# Patient Record
Sex: Female | Born: 1985 | Race: White | Hispanic: No | Marital: Married | Smoking: Never smoker
Health system: Southern US, Academic
[De-identification: ages and names within clinical notes are randomized; demographics above are authoritative.]

## PROBLEM LIST (undated history)

## (undated) DIAGNOSIS — F32A Depression, unspecified: Secondary | ICD-10-CM

## (undated) DIAGNOSIS — F419 Anxiety disorder, unspecified: Secondary | ICD-10-CM

## (undated) DIAGNOSIS — M549 Dorsalgia, unspecified: Secondary | ICD-10-CM

## (undated) HISTORY — PX: TUBAL LIGATION: SHX77

## (undated) HISTORY — PX: BREAST ENHANCEMENT SURGERY: SHX7

## (undated) HISTORY — PX: HX APPENDECTOMY: SHX54

---

## 2007-11-14 ENCOUNTER — Encounter (EMERGENCY_DEPARTMENT_HOSPITAL): Payer: Self-pay

## 2007-11-14 ENCOUNTER — Emergency Department
Admission: EM | Admit: 2007-11-14 | Discharge: 2007-11-14 | Disposition: A | Discharge status: Home / Self Care | Payer: Self-pay | Source: Emergency Department | Attending: Emergency Medicine-WVUH | Admitting: Emergency Medicine-WVUH

## 2007-11-14 ENCOUNTER — Encounter (HOSPITAL_COMMUNITY): Payer: Self-pay

## 2007-11-14 DIAGNOSIS — N390 Urinary tract infection, site not specified: Secondary | ICD-10-CM | POA: Insufficient documentation

## 2007-11-14 LAB — URINALYSIS MACROSCOPIC WITH REFLEX TO MICROSCOPIC URINALYSIS (CULTURE NOT PERFORMED)
BILIRUBIN: NEGATIVE
GLUCOSE: NEGATIVE mg/dL
PH URINE: 5.5 (ref 5.0–8.0)
SPECIFIC GRAVITY, URINE: 1.007 (ref 1.005–1.030)

## 2007-11-14 LAB — URINALYSIS, MICROSCOPIC: WBC'S: 143 /HPF — ABNORMAL HIGH (ref 0–6)

## 2007-11-14 MED ORDER — PHENAZOPYRIDINE 200 MG TABLET
200.00 mg | ORAL_TABLET | Freq: Three times a day (TID) | ORAL | Status: DC
Start: 2007-11-14 — End: 2007-12-23

## 2007-11-14 MED ORDER — NITROFURANTOIN MONOHYDRATE/MACROCRYSTALS 100 MG CAPSULE
100.00 mg | ORAL_CAPSULE | Freq: Two times a day (BID) | ORAL | Status: DC
Start: 2007-11-14 — End: 2007-12-23

## 2007-11-14 NOTE — ED Provider Notes (Signed)
Urinary Pain   The history is provided by the patient. This is a new problem. The current episode started 2 days ago. The problem has been occurring every urination. The problem has been gradually worsening since onset. The quality of the pain is burning. The pain is moderate. There has been no fever. There is no history of pyelonephritis. Associated symptoms include frequency and urgency. Pertinent negatives include no chills, no nausea, no vomiting, no discharge and no flank pain. She has tried nothing for the symptoms.           Review of Systems   Constitutional: Negative for fever and chills.   Gastrointestinal: Negative for nausea, vomiting and abdominal pain.   Genitourinary: Positive for dysuria, urgency and frequency. Negative for flank pain.   Musculoskeletal: Negative for back pain.           History: No past medical history on file.  Past Surgical History   Procedure Date   . Hx appendectomy        History   Social History   . Marital Status: Married     Spouse Name: N/A     Number of Children: N/A   . Years of Education: N/A   Occupational History   . Not on file.   Social History Main Topics   . Tobacco Use: Never   . Alcohol Use: No   . Drug Use: No   . Sexually Active: Not on file   Other Topics Concern   . Not on file   Social History Narrative   . No narrative on file             Physical Exam   Nursing note and vitals reviewed.  Constitutional: She is oriented. She appears well-developed and well-nourished. No distress.   Abdominal: She exhibits no distension. Soft. No tenderness. She has no guarding and no CVA tenderness.   Neurological: She is alert and oriented.   Skin: Skin is warm and dry.   Psychiatric: She has a normal mood and affect. Her behavior is normal. Judgment and thought content normal.           ED Course:    Results:     ED Course:    Evaluation Plan: UTI  Macrobid 100mg  1 po bid x 7d  Pyridium 200mg  #6  F/u as needed.

## 2007-11-14 NOTE — ED Nurses Note (Signed)
Dc instructions reviewed patient home without further questions.  To follow up as needed.

## 2007-11-14 NOTE — ED Nurses Note (Signed)
DC instructions reviewed patient home without further questions

## 2007-11-14 NOTE — ED Nurses Note (Signed)
 U/A collected and sent.

## 2007-11-14 NOTE — ED Nurses Note (Signed)
Patient c/o dysuria and frequency- denies any flank pain, N/V/D.  Patient taken to restroom to obtain a urine specimen.

## 2007-11-14 NOTE — Care Management Notes (Signed)
 Pt requesting assistance with prescriptions for peridium and macrobid . Financial Assessment completed. Arrangements made for pt to have scripts filled at [facility] with bill to Care Management.

## 2007-11-15 LAB — URINE CULTURE,ROUTINE

## 2007-11-25 ENCOUNTER — Ambulatory Visit (INDEPENDENT_AMBULATORY_CARE_PROVIDER_SITE_OTHER): Payer: Self-pay

## 2007-12-23 ENCOUNTER — Encounter (HOSPITAL_COMMUNITY): Payer: Self-pay

## 2007-12-23 ENCOUNTER — Emergency Department
Admission: EM | Admit: 2007-12-23 | Discharge: 2007-12-23 | Discharge status: Against Medical Advice | Payer: 59 | Source: Emergency Department | Attending: Emergency Medicine | Admitting: Emergency Medicine

## 2007-12-23 DIAGNOSIS — Z5320 Procedure and treatment not carried out because of patient's decision for unspecified reasons: Secondary | ICD-10-CM | POA: Insufficient documentation

## 2007-12-23 DIAGNOSIS — N23 Unspecified renal colic: Secondary | ICD-10-CM | POA: Insufficient documentation

## 2007-12-24 ENCOUNTER — Emergency Department
Admission: EM | Admit: 2007-12-24 | Discharge: 2007-12-24 | Disposition: A | Discharge status: Home / Self Care | Payer: 59 | Source: Emergency Department | Attending: Emergency Medicine-WVUH | Admitting: Emergency Medicine-WVUH

## 2007-12-24 ENCOUNTER — Encounter (EMERGENCY_DEPARTMENT_HOSPITAL): Payer: 59

## 2007-12-24 ENCOUNTER — Encounter (HOSPITAL_COMMUNITY): Payer: Self-pay

## 2007-12-24 DIAGNOSIS — N390 Urinary tract infection, site not specified: Secondary | ICD-10-CM | POA: Insufficient documentation

## 2007-12-24 LAB — URINALYSIS, MACROSCOPIC
BILIRUBIN: NEGATIVE
GLUCOSE: NEGATIVE mg/dL
KETONES: 40 mg/dL — AB
NITRITE: NEGATIVE
PH URINE: 6 (ref 5.0–8.0)
SPECIFIC GRAVITY, URINE: 1.018 (ref 1.005–1.030)

## 2007-12-24 LAB — URINALYSIS, MICROSCOPIC: WBC'S: 26 /HPF — ABNORMAL HIGH (ref 0–6)

## 2007-12-24 NOTE — ED Nurses Note (Signed)
Discharged with Rx's and instructions from Leonor Liv, PA and Dr. Dewaine Conger.  She verbalized understanding with instructions.

## 2007-12-24 NOTE — Care Management Notes (Signed)
 Contacted by pt requesting assistance paying for prescriptions that were written in the ED today. Pt has scripts for cipro and pyridium . Care Managements has assisted pt with medications in the past, but did not exceed limit for help. Arrangments made for Care Management to cover the cost of these meds at [facility] ($6.87).

## 2007-12-24 NOTE — ED Nurses Note (Signed)
Secondary history obtained from patient.  She states that she was seen by Dr. Wyn Quaker, for a UTI about four weeks ago, and is not getting any better.  States she has been controlling her symptoms with tylenol.  States she was on macrobid and pyridium.  States that antibiotic did not help.  She is obtaining a urine specimen.

## 2007-12-24 NOTE — ED Provider Notes (Signed)
Urinary Pain   The history is provided by the patient. This is a recurrent problem. The current episode started more than 1 week ago. The problem has been occurring every urination. The problem has been gradually worsening since onset. The quality of the pain is burning. The pain is at a severity of 3/10. There has been no fever. She is sexually active. There is no history of pyelonephritis. Associated symptoms include chills, nausea, frequency and urgency. Pertinent negatives include no vomiting, no discharge, no hematuria, no hesitancy, no possible pregnancy and no flank pain. She has tried nothing for the symptoms. Her past medical history is significant for recurrent UTIs.     Pt presents c/o UTI x 1 week. States that she was here one month ago with similar sxs, was treated with Macrobid and Pyridium and symptoms seemed to get better and go away until last week. Symptoms include dysuria, frequency, urgency. No vaginal discharge. No N/V. No fevers or chills. No back pain.      Review of Systems   Constitutional: Positive for chills. Negative for fever.   Skin: Negative.    HENT: Negative.    Cardiovascular: Negative.    Respiratory: Negative.    Gastrointestinal: Positive for nausea. Negative for vomiting and abdominal pain.   Genitourinary: Positive for dysuria, urgency and frequency. Negative for hesitancy, hematuria and flank pain.           History: No past medical history on file.    Past Surgical History   Procedure Date   . Hx appendectomy          No family history on file.    History   Social History   . Marital Status: Married     Spouse Name: N/A     Number of Children: N/A   . Years of Education: N/A   Occupational History   . Not on file.   Social History Main Topics   . Tobacco Use: Never   . Alcohol Use: No   . Drug Use: No   . Sexually Active: Not on file   Other Topics Concern   . Not on file   Social History Narrative   . No narrative on file               Physical Exam    Constitutional: She is oriented. She appears well-developed and well-nourished. She appears not diaphoretic. No distress.   HENT:   Head: Normocephalic and atraumatic.   Right Ear: External ear normal.   Left Ear: External ear normal.   Eyes: Conjunctivae and extraocular motions are normal. Pupils are equal, round, and reactive to light.   Neck: Normal range of motion. Neck supple.   Cardiovascular: Normal rate, regular rhythm and normal heart sounds.    Pulmonary/Chest: Effort normal and breath sounds normal.   Abdominal: She has no CVA tenderness.   Neurological: She is alert and oriented. No cranial nerve deficit.   Skin: Skin is warm and dry. She is not diaphoretic.   Psychiatric: She has a normal mood and affect. Her behavior is normal. Judgment and thought content normal.           ED Course:    Results:     ED Course: Pt evaluated in fast track    UA, chlamydia/gonorrhea by urine PCR obtained    Evaluation Plan:  Cipro 500mg  1 PO BID x 10d  Pyridium 200mg  TID x 2d    F/U PCP PRN

## 2013-06-26 ENCOUNTER — Ambulatory Visit (HOSPITAL_BASED_OUTPATIENT_CLINIC_OR_DEPARTMENT_OTHER): Payer: Self-pay | Admitting: Advanced Practice Midwife

## 2013-07-07 ENCOUNTER — Other Ambulatory Visit (HOSPITAL_BASED_OUTPATIENT_CLINIC_OR_DEPARTMENT_OTHER): Payer: Self-pay | Admitting: Advanced Practice Midwife

## 2013-07-07 ENCOUNTER — Ambulatory Visit
Admission: RE | Admit: 2013-07-07 | Discharge: 2013-07-07 | Disposition: A | Discharge status: Home / Self Care | Payer: 59 | Source: Ambulatory Visit | Attending: OBSTETRICS/GYNECOLOGY | Admitting: OBSTETRICS/GYNECOLOGY

## 2013-07-07 ENCOUNTER — Encounter (HOSPITAL_BASED_OUTPATIENT_CLINIC_OR_DEPARTMENT_OTHER): Payer: Self-pay | Admitting: Advanced Practice Midwife

## 2013-07-07 ENCOUNTER — Ambulatory Visit (HOSPITAL_BASED_OUTPATIENT_CLINIC_OR_DEPARTMENT_OTHER): Payer: 59 | Admitting: Advanced Practice Midwife

## 2013-07-07 VITALS — BP 98/66 | Ht 63.0 in | Wt 166.9 lb

## 2013-07-07 DIAGNOSIS — N926 Irregular menstruation, unspecified: Secondary | ICD-10-CM

## 2013-07-07 DIAGNOSIS — Z3480 Encounter for supervision of other normal pregnancy, unspecified trimester: Secondary | ICD-10-CM

## 2013-07-07 DIAGNOSIS — Z124 Encounter for screening for malignant neoplasm of cervix: Secondary | ICD-10-CM | POA: Insufficient documentation

## 2013-07-07 DIAGNOSIS — Z3400 Encounter for supervision of normal first pregnancy, unspecified trimester: Secondary | ICD-10-CM | POA: Insufficient documentation

## 2013-07-07 DIAGNOSIS — Z9089 Acquired absence of other organs: Secondary | ICD-10-CM | POA: Insufficient documentation

## 2013-07-07 DIAGNOSIS — Z1151 Encounter for screening for human papillomavirus (HPV): Secondary | ICD-10-CM | POA: Insufficient documentation

## 2013-07-07 NOTE — Progress Notes (Addendum)
Patient here with husband Nate for New OB visit.  Planned pregnancy dated by sure LMP.  G1 P0  Feeling well.  Questions regarding timing of u/s.  Planning homebirth, doesn't have provider chosen yet.  Establishing care for hospital OB care as needed.    NOB History taken as charted below:  No past medical history on file.   Past Surgical History   Procedure Laterality Date   . Hx appendectomy        Family History   Problem Relation Age of Onset   . Breast Cancer Neg Hx    . Ovarian Cancer Neg Hx    . Uterine Cancer Neg Hx    . Colon Cancer Neg Hx    . Pancreatic Cancer Neg Hx    . Lung Cancer Maternal Grandfather    . Heart Disease Maternal Uncle    . Heart Disease       cousin   . Diabetes Father         Other than ROS in the HPI, all other systems were negative.     Prenatal physical done as charted below:  OB Prenatal Exam: Filed by Tona Sensingerbone, Juandaniel Manfredo, CNM on 07/07/2013  5:34 PM    General Physical Exam    HEENT: Normal  Heart: Normal  Skin: Normal     Thyroid: Normal  Lungs: Normal  Extremities: Normal     Lymph Nodes: Normal  Breasts: Normal  Neurological: Normal     Abdomen: Normal            Pelvic Exam    Vulva: Normal  Vagina: Normal     Cervix: Normal  Uterus: 12 Weeks     Adnexa: Normal  Rectum:   deferred    Spines: Average  Subpubic Arch: Normal     Diagonal Conjugate:   > 12 cm Pelvic Type: Gynecoid          1+1+ DTRs, no clonus        FHTs were heard with Doppler.    Assessment:      IUP at 11 weeks 3 days by dates       S=D      Desires Midwifery Care.   Considering homebirth     Declines First trimester screen.    Plan:    NOB teaching done.      OB Warning s/s taught and how to call reviewed.     Prenatal labs, Pap, and cultures today.     [employer] [facility] overview given with "goody bag" and handouts     RTO 8 weeks ROB and u/s, sooner prn.  Will RTO sooner if homebirth provider not found.    CNM saw patient independently.  Physician available on-site for consultation, collaboration, or referral as  per CNM certification and licensure.    Tona SensingLena Ailsa Mireles, CNM

## 2013-07-08 LAB — NEISSERIA GONORRHOEAE DNA BY PCR: NEISSERIA GONORRHOEAE: NEGATIVE

## 2013-07-08 LAB — CHLAMYDIA TRACHOMITIS DNA BY PCR (INHOUSE): CHLAMYDIA TRACHOMATIS: NEGATIVE

## 2013-07-08 LAB — URINE CULTURE: CULTURE OBSERVATION: 20000

## 2013-07-09 LAB — HISTORICAL CYTOPATHOLOGY-GYN (PAP AND HPV TESTS)

## 2013-07-24 ENCOUNTER — Encounter (HOSPITAL_BASED_OUTPATIENT_CLINIC_OR_DEPARTMENT_OTHER): Payer: Self-pay

## 2013-08-04 ENCOUNTER — Encounter (HOSPITAL_BASED_OUTPATIENT_CLINIC_OR_DEPARTMENT_OTHER): Payer: Self-pay | Admitting: Advanced Practice Midwife

## 2013-09-01 ENCOUNTER — Ambulatory Visit
Admission: RE | Admit: 2013-09-01 | Discharge: 2013-09-01 | Disposition: A | Discharge status: Home / Self Care | Payer: 59 | Source: Ambulatory Visit | Attending: Advanced Practice Midwife | Admitting: Advanced Practice Midwife

## 2013-09-01 ENCOUNTER — Encounter (HOSPITAL_BASED_OUTPATIENT_CLINIC_OR_DEPARTMENT_OTHER): Payer: 59 | Admitting: Advanced Practice Midwife

## 2013-09-01 DIAGNOSIS — N926 Irregular menstruation, unspecified: Secondary | ICD-10-CM | POA: Insufficient documentation

## 2013-09-01 DIAGNOSIS — O321XX0 Maternal care for breech presentation, not applicable or unspecified: Secondary | ICD-10-CM

## 2013-10-11 ENCOUNTER — Encounter (HOSPITAL_BASED_OUTPATIENT_CLINIC_OR_DEPARTMENT_OTHER): Payer: Self-pay | Admitting: Advanced Practice Midwife

## 2013-10-11 DIAGNOSIS — O358XX0 Maternal care for other (suspected) fetal abnormality and damage, not applicable or unspecified: Secondary | ICD-10-CM | POA: Insufficient documentation

## 2013-10-11 DIAGNOSIS — O35BXX0 Maternal care for other (suspected) fetal abnormality and damage, fetal cardiac anomalies, not applicable or unspecified: Secondary | ICD-10-CM | POA: Insufficient documentation

## 2013-11-10 ENCOUNTER — Encounter (HOSPITAL_BASED_OUTPATIENT_CLINIC_OR_DEPARTMENT_OTHER): Payer: 59 | Admitting: Advanced Practice Midwife

## 2014-01-05 ENCOUNTER — Other Ambulatory Visit: Payer: Self-pay

## 2015-02-13 ENCOUNTER — Other Ambulatory Visit: Payer: Self-pay

## 2016-02-18 ENCOUNTER — Other Ambulatory Visit: Payer: Self-pay

## 2020-05-17 ENCOUNTER — Encounter (INDEPENDENT_AMBULATORY_CARE_PROVIDER_SITE_OTHER): Payer: Self-pay

## 2020-10-25 ENCOUNTER — Emergency Department (INDEPENDENT_AMBULATORY_CARE_PROVIDER_SITE_OTHER)
Admission: EM | Admit: 2020-10-25 | Discharge: 2020-10-25 | Disposition: A | Discharge status: Home / Self Care | Source: Home / Self Care

## 2020-10-25 ENCOUNTER — Other Ambulatory Visit: Payer: Self-pay

## 2020-10-25 ENCOUNTER — Ambulatory Visit (HOSPITAL_BASED_OUTPATIENT_CLINIC_OR_DEPARTMENT_OTHER)
Admission: RE | Admit: 2020-10-25 | Discharge: 2020-10-25 | Disposition: A | Discharge status: Home / Self Care | Source: Ambulatory Visit | Attending: Family Medicine | Admitting: Family Medicine

## 2020-10-25 DIAGNOSIS — M542 Cervicalgia: Secondary | ICD-10-CM | POA: Diagnosis present

## 2020-10-25 DIAGNOSIS — S161XXA Strain of muscle, fascia and tendon at neck level, initial encounter: Secondary | ICD-10-CM

## 2020-10-25 HISTORY — DX: Anxiety disorder, unspecified: F41.9

## 2020-10-25 HISTORY — DX: Depression, unspecified: F32.A

## 2020-10-25 HISTORY — DX: Dorsalgia, unspecified: M54.9

## 2020-10-25 MED ORDER — BACLOFEN 10 MG PO TABS: 10.0000 mg | ORAL_TABLET | Freq: Three times a day (TID) | ORAL | 0 refills | Status: AC

## 2020-10-25 MED ORDER — METHYLPREDNISOLONE 4 MG PO TBPK: ORAL_TABLET | ORAL | 0 refills | Status: AC
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
## 2020-10-25 NOTE — Progress Notes (Signed)
Patient called by M.Ave Filter, FNP  to discuss test results
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
## 2020-10-25 NOTE — ED Notes (Signed)
Pt to [facility] radiology dept for an ultra sound after leaving here - see order

## 2020-10-25 NOTE — Discharge Instructions (Addendum)
Advised patient to go to [facility] now for ultrasound of soft tissue of neck.  Advised have allowed immediate release of results to patient and we will follow-up with phone call once images have been obtained.  Called and spoke with patient at 1440 this afternoon to give her results of recently ordered ultrasound/soft tissues of neck and to advise medications that I would be sending to her pharmacy.  All questions answered prior to ending this phone conversation and advised patient to follow-up with PCP or here if symptoms worsen and or unresolved.

## 2022-07-15 IMAGING — US US SOFT TISSUE HEAD/NECK
1 series · 14 of 25 positions shown · non-contrast
Comparison: None.

CLINICAL DATA: Left neck pain

EXAM:
ULTRASOUND OF HEAD/NECK SOFT TISSUES
TECHNIQUE: Ultrasound examination of the head and neck soft tissues was
performed in the area of clinical concern.

[Series 1: us soft tissue head/neck · 37 acquisitions, 14 frames shown]
[im 1/37]
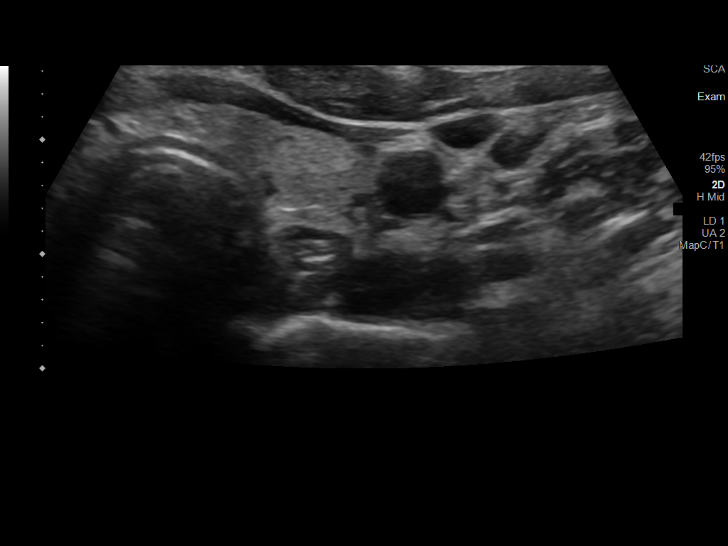
[im 4/37]
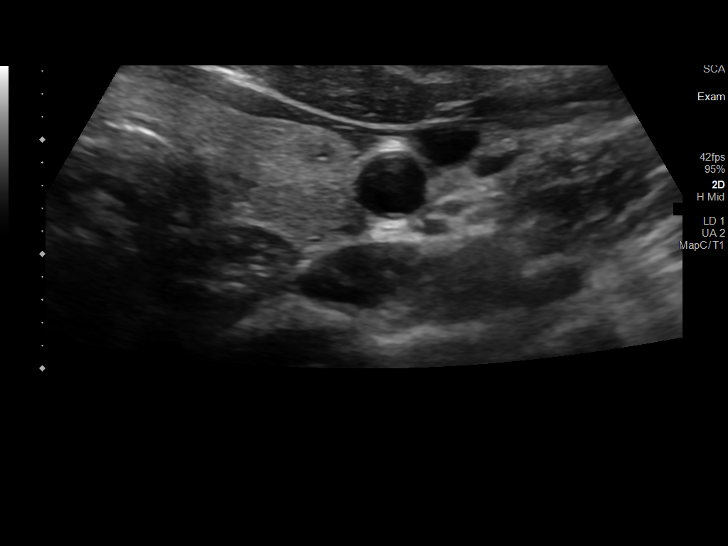
[im 7/37]
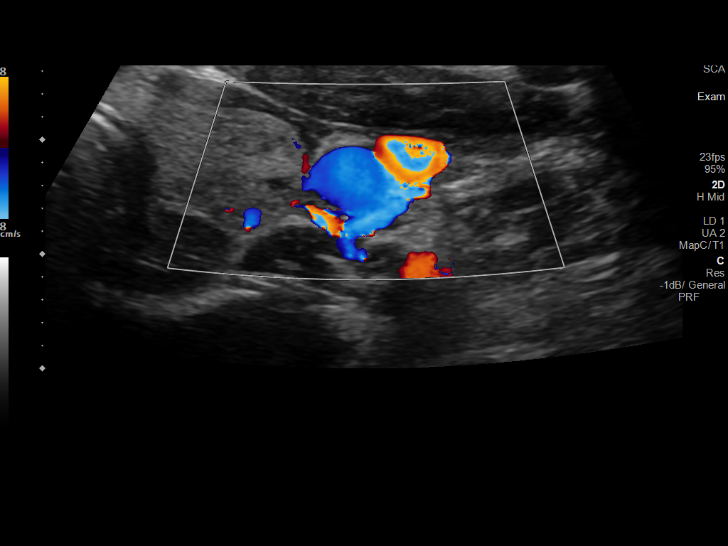
[im 10/37]
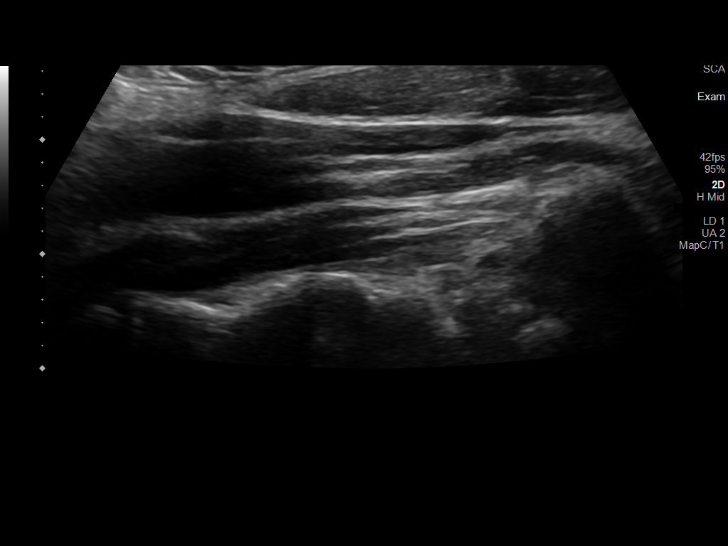
[im 13/37]
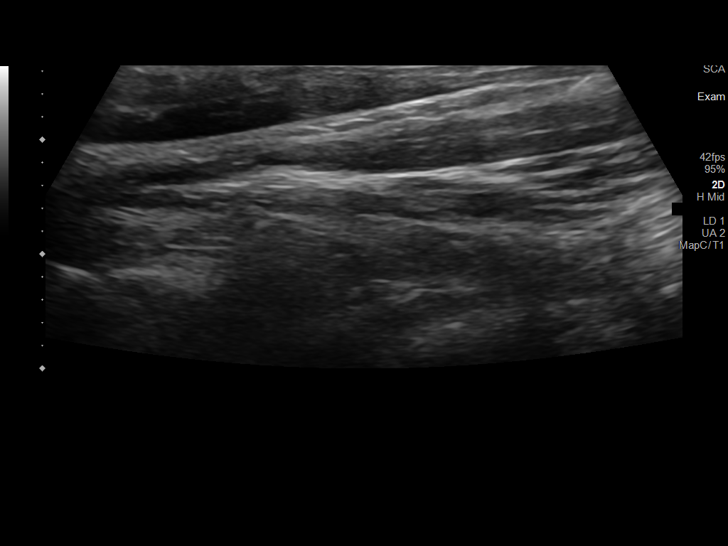
[im 14/37]
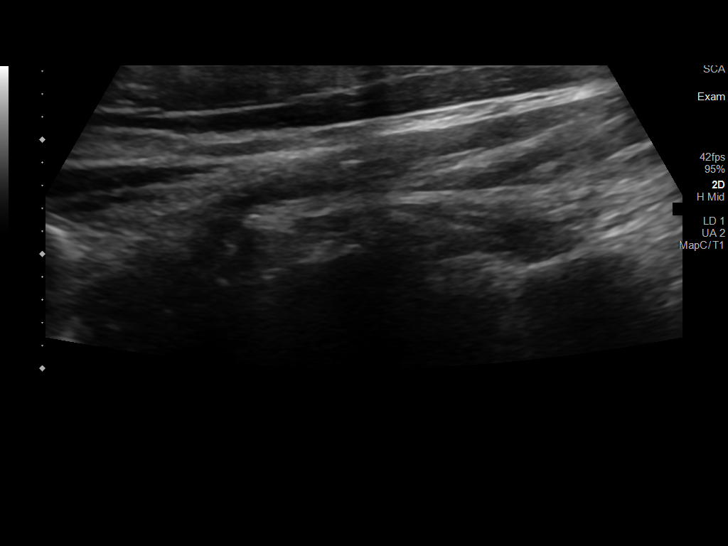
[im 17/37]
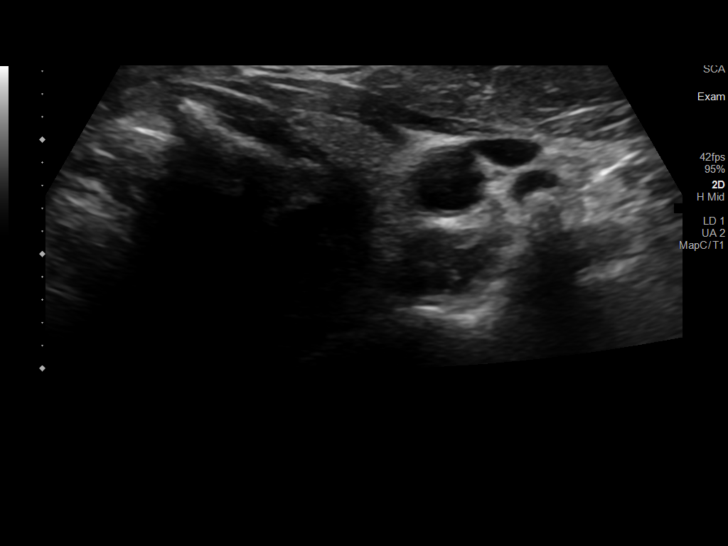
[im 20/37]
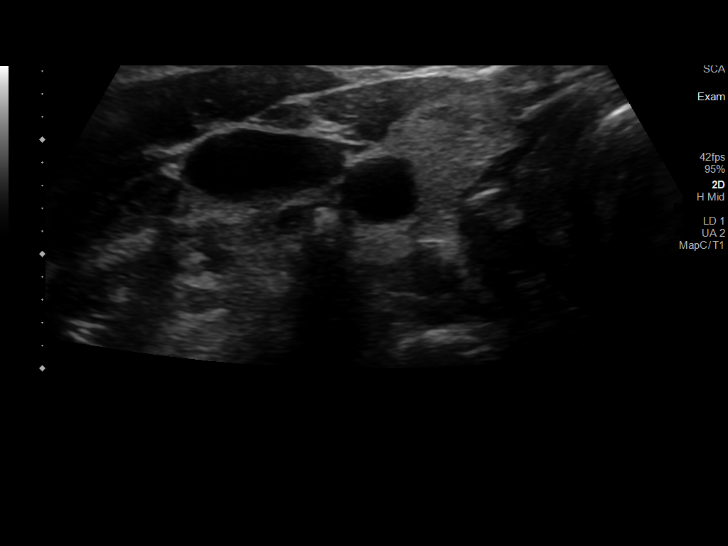
[im 23/37]
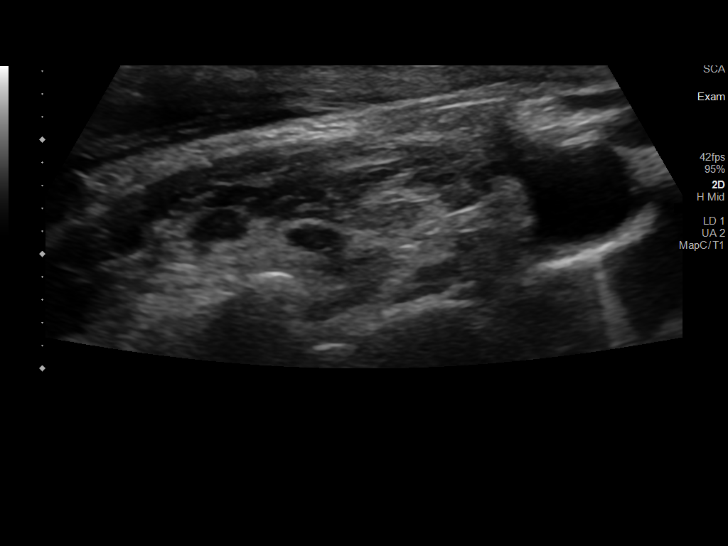
[im 25/37]
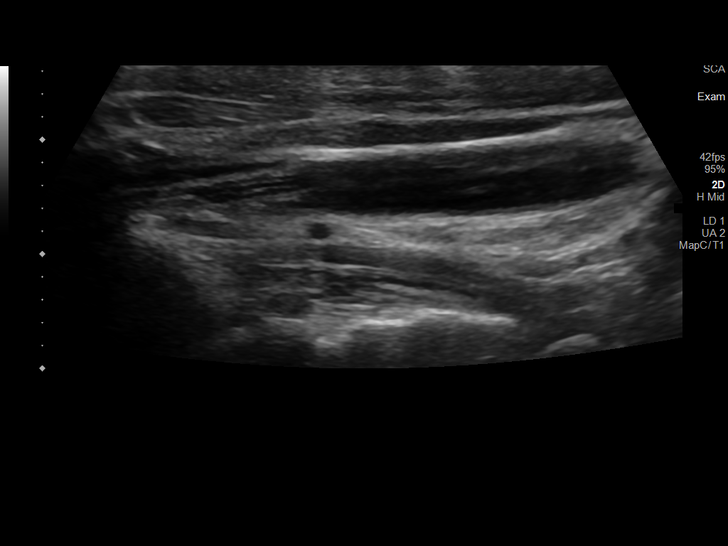
[im 28/37]
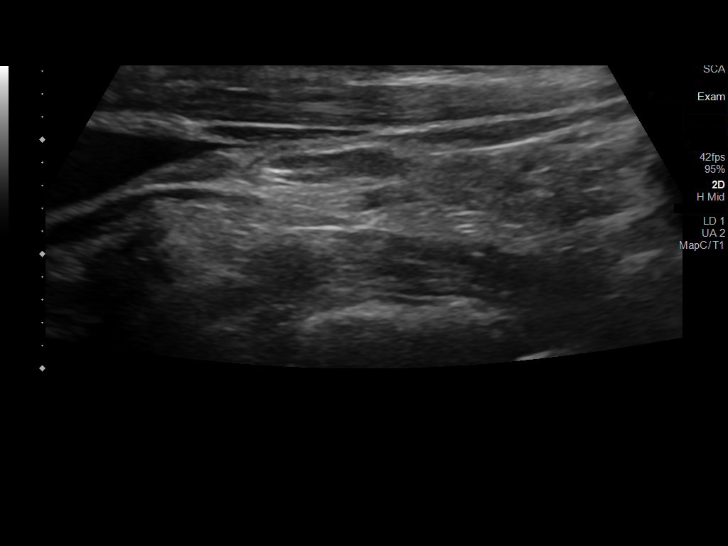
[im 31/37]
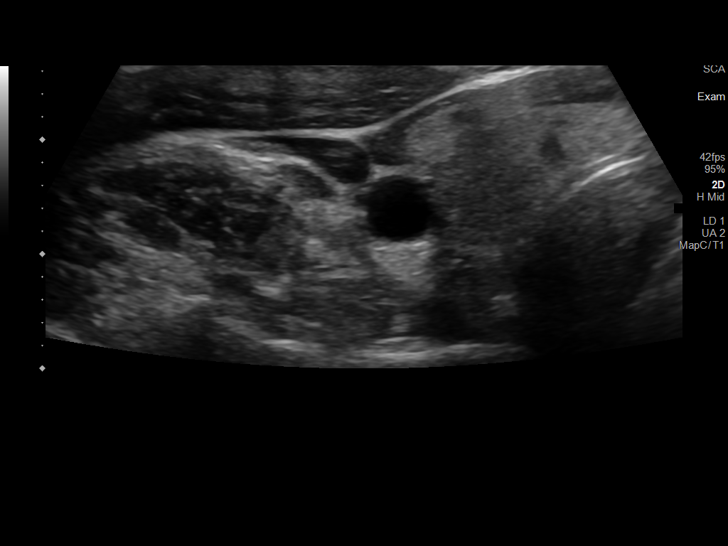
[im 34/37]
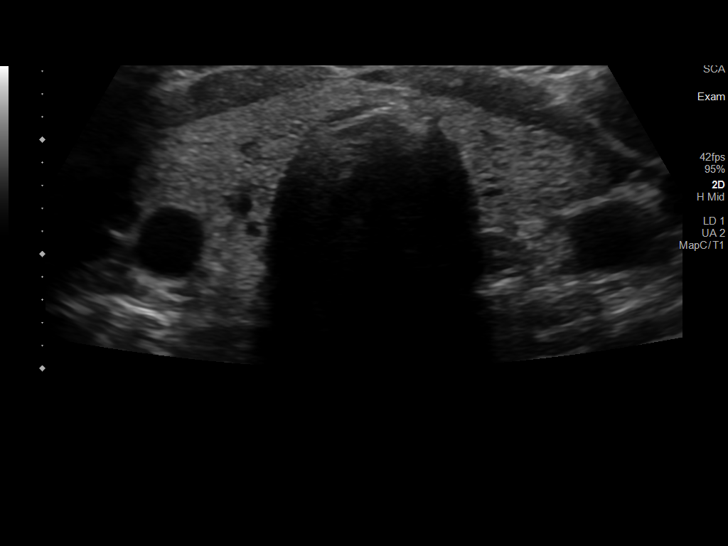
[im 37/37]
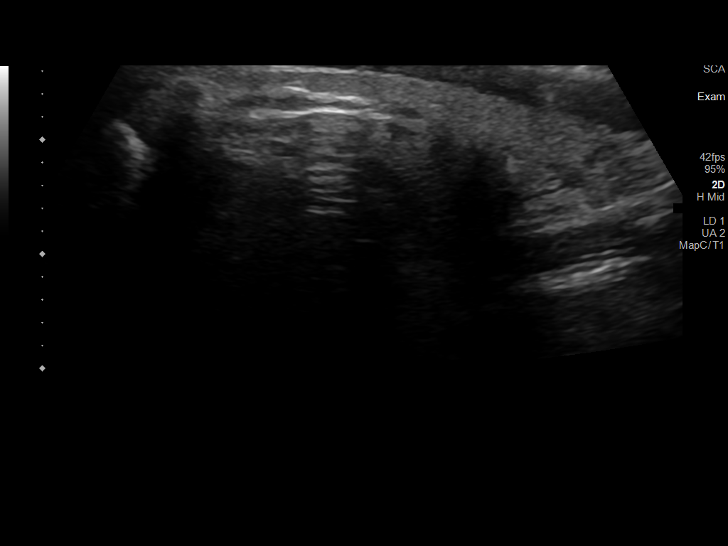

[14 of 25 positions shown; findings below may reference images not displayed]

FINDINGS: Ultrasound performed of the left neck area of concern. Imaging of
the right neck also performed for comparison.

No underlying superficial soft tissue mass, cyst, fluid collection,
hematoma, or bulky adenopathy.
IMPRESSION: No significant superficial soft tissue abnormality by ultrasound.
